# Patient Record
Sex: Male | Born: 2000 | Race: Black or African American | Hispanic: No | Marital: Single
Health system: Southern US, Community
[De-identification: ages and names within clinical notes are randomized; demographics above are authoritative.]

## PROBLEM LIST (undated history)

## (undated) HISTORY — PX: HYPOSPADIAS CORRECTION: SHX483

---
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
## 2022-03-07 ENCOUNTER — Other Ambulatory Visit (HOSPITAL_COMMUNITY): Payer: Self-pay

## 2022-11-21 ENCOUNTER — Ambulatory Visit (HOSPITAL_COMMUNITY)
Admission: EM | Admit: 2022-11-21 | Discharge: 2022-11-21 | Disposition: A | Discharge status: Home / Self Care | Payer: BC Managed Care – PPO | Attending: Physician Assistant | Admitting: Physician Assistant

## 2022-11-21 ENCOUNTER — Encounter (HOSPITAL_COMMUNITY): Payer: Self-pay | Admitting: Emergency Medicine

## 2022-11-21 DIAGNOSIS — H6690 Otitis media, unspecified, unspecified ear: Secondary | ICD-10-CM | POA: Diagnosis not present

## 2022-11-21 MED ORDER — AMOXICILLIN-POT CLAVULANATE 875-125 MG PO TABS: 1.0000 | ORAL_TABLET | Freq: Two times a day (BID) | ORAL | 0 refills | Status: AC

## 2022-11-21 NOTE — ED Triage Notes (Signed)
Pt c/o right ear pain for 2 days. Reports tried drops without relief. Reports on the end of sinus infection and unsure if related to congestion.

## 2022-11-21 NOTE — ED Provider Notes (Signed)
[facility]    CSN: [id number] Arrival date & time: 11/21/22  1915      History   Chief Complaint Chief Complaint  Patient presents with   Otalgia    HPI Wesley Harper is a 22 y.o. male.   Patient complains of right ear pain that started several days ago.  He reports he has had nasal congestion and sinus pressure for about 5 days.  He denies fever, chills, nausea, vomiting, cough, shortness of breath, wheezing.  He has taken nothing for the sx.     History reviewed. No pertinent past medical history.  There are no problems to display for this patient.   Past Surgical History:  Procedure Laterality Date   HYPOSPADIAS CORRECTION         Home Medications    Prior to Admission medications   Medication Sig Start Date End Date Taking? Authorizing Provider  amoxicillin-clavulanate (AUGMENTIN) 875-125 MG tablet Take 1 tablet by mouth every 12 (twelve) hours for 7 days. 11/21/22 11/28/22 Yes Ward, Tylene Fantasia, PA-C  ibuprofen (ADVIL,MOTRIN) 100 MG/5ML suspension Take 100 mg by mouth every 6 (six) hours as needed for fever.    [provider]  ibuprofen (ADVIL,MOTRIN) 200 MG tablet Take 400 mg by mouth every 6 (six) hours as needed for pain.    [provider]    Family History No family history on file.  Social History     Allergies   Patient has no known allergies.   Review of Systems Review of Systems  Constitutional:  Negative for chills and fever.  HENT:  Positive for congestion and ear pain. Negative for sore throat.   Eyes:  Negative for pain and visual disturbance.  Respiratory:  Negative for cough and shortness of breath.   Cardiovascular:  Negative for chest pain and palpitations.  Gastrointestinal:  Negative for abdominal pain and vomiting.  Genitourinary:  Negative for dysuria and hematuria.  Musculoskeletal:  Negative for arthralgias and back pain.  Skin:  Negative for color change and rash.  Neurological:  Negative for  seizures and syncope.  All other systems reviewed and are negative.    Physical Exam Triage Vital Signs ED Triage Vitals  Enc Vitals Group     BP 11/21/22 2016 (!) 152/88     Pulse Rate 11/21/22 2016 79     Resp 11/21/22 2016 16     Temp 11/21/22 2016 98.3 F (36.8 C)     Temp Source 11/21/22 2016 Oral     SpO2 11/21/22 2016 99 %     Weight --      Height --      Head Circumference --      Peak Flow --      Pain Score 11/21/22 2015 6     Pain Loc --      Pain Edu? --      Excl. in GC? --    No data found.  Updated Vital Signs BP (!) 152/88 (BP Location: Left Arm)   Pulse 79   Temp 98.3 F (36.8 C) (Oral)   Resp 16   SpO2 99%   Visual Acuity Right Eye Distance:   Left Eye Distance:   Bilateral Distance:    Right Eye Near:   Left Eye Near:    Bilateral Near:     Physical Exam Vitals and nursing note reviewed.  Constitutional:      General: He is not in acute distress.    Appearance: He is  well-developed.  HENT:     Head: Normocephalic and atraumatic.     Right Ear: Hearing normal. Tympanic membrane is erythematous and bulging.     Left Ear: Hearing, tympanic membrane and ear canal normal.  Eyes:     Conjunctiva/sclera: Conjunctivae normal.  Cardiovascular:     Rate and Rhythm: Normal rate and regular rhythm.     Heart sounds: No murmur heard. Pulmonary:     Effort: Pulmonary effort is normal. No respiratory distress.     Breath sounds: Normal breath sounds.  Abdominal:     Palpations: Abdomen is soft.     Tenderness: There is no abdominal tenderness.  Musculoskeletal:        General: No swelling.     Cervical back: Neck supple.  Skin:    General: Skin is warm and dry.     Capillary Refill: Capillary refill takes less than 2 seconds.  Neurological:     Mental Status: He is alert.  Psychiatric:        Mood and Affect: Mood normal.      UC Treatments / Results  Labs (all labs ordered are listed, but only abnormal results are displayed) Labs  Reviewed - No data to display  EKG   Radiology No results found.  Procedures Procedures (including critical care time)  Medications Ordered in UC Medications - No data to display  Initial Impression / Assessment and Plan / UC Course  I have reviewed the triage vital signs and the nursing notes.  Pertinent labs & imaging results that were available during my care of the patient were reviewed by me and considered in my medical decision making (see chart for details).     Right otitis media.  Antibiotic prescribed.  Supportive care discussed.  Return precautions discussed. Final Clinical Impressions(s) / UC Diagnoses   Final diagnoses:  Acute otitis media, unspecified otitis media type     Discharge Instructions      Take antibiotic as prescribed. Can use Mucinex and Flonase for congestion. Take ibuprofen as needed for pain and inflammation, If no improvement return in clinic for evaluation.   ED Prescriptions     Medication Sig Dispense Auth. Provider   amoxicillin-clavulanate (AUGMENTIN) 875-125 MG tablet Take 1 tablet by mouth every 12 (twelve) hours for 7 days. 14 tablet Ward, Tylene Fantasia, PA-C      PDMP not reviewed this encounter.   Ward, Tylene Fantasia, PA-C 11/21/22 2028

## 2022-11-21 NOTE — Discharge Instructions (Addendum)
Take antibiotic as prescribed. Can use Mucinex and Flonase for congestion. Take ibuprofen as needed for pain and inflammation, If no improvement return in clinic for evaluation.

## 2022-11-22 ENCOUNTER — Ambulatory Visit (HOSPITAL_COMMUNITY): Payer: Self-pay
